# Patient Record
Sex: Male | Born: 1980 | Race: Black or African American | Hispanic: No | Marital: Single | Smoking: Never smoker
Health system: Southern US, Community
[De-identification: ages and names within clinical notes are randomized; demographics above are authoritative.]

## PROBLEM LIST (undated history)

## (undated) DIAGNOSIS — Q909 Down syndrome, unspecified: Secondary | ICD-10-CM

---
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
## 2002-05-24 ENCOUNTER — Emergency Department (HOSPITAL_COMMUNITY): Admission: EM | Admit: 2002-05-24 | Discharge: 2002-05-25 | Payer: Self-pay | Admitting: Emergency Medicine

## 2004-07-03 ENCOUNTER — Emergency Department (HOSPITAL_COMMUNITY): Admission: EM | Admit: 2004-07-03 | Discharge: 2004-07-03 | Payer: Self-pay | Admitting: Emergency Medicine

## 2004-09-07 ENCOUNTER — Emergency Department (HOSPITAL_COMMUNITY): Admission: EM | Admit: 2004-09-07 | Discharge: 2004-09-07 | Payer: Self-pay | Admitting: Emergency Medicine

## 2017-03-02 ENCOUNTER — Emergency Department (HOSPITAL_COMMUNITY): Payer: Medicaid Other

## 2017-03-02 ENCOUNTER — Encounter (HOSPITAL_COMMUNITY): Payer: Self-pay | Admitting: Emergency Medicine

## 2017-03-02 ENCOUNTER — Emergency Department (HOSPITAL_COMMUNITY)
Admission: EM | Admit: 2017-03-02 | Discharge: 2017-03-02 | Disposition: A | Discharge status: Home / Self Care | Payer: Medicaid Other | Attending: Emergency Medicine | Admitting: Emergency Medicine

## 2017-03-02 DIAGNOSIS — R109 Unspecified abdominal pain: Secondary | ICD-10-CM | POA: Insufficient documentation

## 2017-03-02 DIAGNOSIS — Z79899 Other long term (current) drug therapy: Secondary | ICD-10-CM | POA: Insufficient documentation

## 2017-03-02 HISTORY — DX: Down syndrome, unspecified: Q90.9

## 2017-03-02 LAB — URINALYSIS, ROUTINE W REFLEX MICROSCOPIC
Bilirubin Urine: NEGATIVE
Glucose, UA: NEGATIVE mg/dL
Hgb urine dipstick: NEGATIVE
Ketones, ur: NEGATIVE mg/dL
Leukocytes, UA: NEGATIVE
Nitrite: NEGATIVE
Protein, ur: NEGATIVE mg/dL
Specific Gravity, Urine: 1.001 — ABNORMAL LOW (ref 1.005–1.030)
pH: 7 (ref 5.0–8.0)

## 2017-03-02 MED ORDER — POLYETHYLENE GLYCOL 3350 17 GM/SCOOP PO POWD: 17.0000 g | Freq: Two times a day (BID) | ORAL | 0 refills | Status: AC

## 2017-03-02 MED ORDER — IBUPROFEN 100 MG/5ML PO SUSP
400.0000 mg | Freq: Once | ORAL | Status: AC
  Administered 2017-03-02: 400 mg via ORAL
  Filled 2017-03-02: qty 20

## 2017-03-02 NOTE — ED Notes (Signed)
Transported to radiology 

## 2017-03-02 NOTE — ED Provider Notes (Signed)
[facility]-EMERGENCY DEPT Provider Note   CSN: [id number] Arrival date & time: 03/02/17  0030   By signing my name below, I, Guy Mcfarland, attest that this documentation has been prepared under the direction and in the presence of [facility] Shaneta Cervenka, [name]. Electronically Signed: Teofilo PodMatthew P. Mcfarland, ED Scribe. 03/02/2017. 1:42 AM.   History   Chief Complaint Chief Complaint  Patient presents with  . Flank Pain    The history is provided by medical records and a relative. The history is limited by a developmental delay. No language interpreter was used.   HPI Comments:  Guy Mcfarland is a 36 y.o. male with PMHx of Down syndrome who presents to the Emergency Department with mom who reports possible flank pain x 3 hours. Mom reports that when pt laid down at home after he went to his program he was expressing pain by grimacing and she reports he cried when she palpated the right flank area. She reports that pt has been walking slower than usual today.  She reports he often has a strong odor about his urine and it was dark today. Mom reports that pt has an inguinal hernia without planned repair. Mom also notes that pt has had a mild cough. She states that he is nonverbal. Mom gave him tylenol 1 hour ago with no relief.   LEVEL 5 CAVEAT for nonverbal.    Past Medical History:  Diagnosis Date  . Down syndrome     There are no active problems to display for this patient.   History reviewed. No pertinent surgical history.     Home Medications    Prior to Admission medications   Medication Sig Start Date End Date Taking? Authorizing Provider  polyethylene glycol powder (GLYCOLAX/MIRALAX) powder Take 17 g by mouth 2 (two) times daily. 03/02/17   [name], [name]    Family History No family history on file.  Social History Social History  Substance Use Topics  . Smoking status: Never Smoker  . Smokeless tobacco: Never Used  . Alcohol use No     Allergies     Penicillins   Review of Systems Review of Systems  Unable to perform ROS: Patient nonverbal     Physical Exam Updated Vital Signs BP (!) 133/96 (BP Location: Left Arm)   Pulse 70   Resp 18   SpO2 100%   Physical Exam  Constitutional: He appears well-developed and well-nourished. No distress.  Awake, alert, nontoxic appearance Intermittently grimacing  HENT:  Head: Normocephalic and atraumatic.  Mouth/Throat: Mucous membranes are normal.  Eyes: Conjunctivae are normal. No scleral icterus.  Neck: Normal range of motion. Neck supple.  Cardiovascular: Normal rate, regular rhythm and intact distal pulses.   Pulmonary/Chest: Effort normal and breath sounds normal. No respiratory distress. He has no wheezes.  Equal chest expansion  Abdominal: Soft. Bowel sounds are normal. He exhibits no distension and no mass. There is no splenomegaly or hepatomegaly. There is no tenderness. There is no rigidity, no rebound, no guarding and no CVA tenderness. A hernia is present. Hernia confirmed positive in the right inguinal area ( easily reducible ). Hernia confirmed negative in the ventral area and confirmed negative in the left inguinal area.  Genitourinary: Testes normal. Cremasteric reflex is present. Right testis shows no mass, no swelling and no tenderness. Right testis is descended. Cremasteric reflex is not absent on the right side. Left testis shows no mass, no swelling and no tenderness. Left testis is descended. Cremasteric reflex is not  absent on the left side. Uncircumcised.  Musculoskeletal: Normal range of motion.  Lymphadenopathy: No inguinal adenopathy noted on the right or left side.  Neurological: He is alert.  Skin: Skin is warm and dry. He is not diaphoretic.  Psychiatric: He has a normal mood and affect.  Nursing note and vitals reviewed.    ED Treatments / Results  DIAGNOSTIC STUDIES:  Oxygen Saturation is 100% on RA, normal by my interpretation.    COORDINATION OF  CARE:  1:36 AM Discussed treatment plan with pt's mom at bedside and she agreed to plan.   Labs (all labs ordered are listed, but only abnormal results are displayed) Labs Reviewed  URINALYSIS, ROUTINE W REFLEX MICROSCOPIC - Abnormal; Notable for the following:       Result Value   Color, Urine COLORLESS (*)    Specific Gravity, Urine 1.001 (*)    All other components within normal limits    Radiology Dg Chest 2 View  Result Date: 03/02/2017 CLINICAL DATA:  Cough for 24 hours. EXAM: CHEST  2 VIEW COMPARISON:  None. FINDINGS: The lungs are clear. The pulmonary vasculature is normal. Heart size is normal. Hilar and mediastinal contours are unremarkable. There is no pleural effusion. IMPRESSION: No active cardiopulmonary disease. Electronically Signed   By: Ellery Plunk M.D.   On: 03/02/2017 02:43    Procedures Procedures (including critical care time)  Medications Ordered in ED Medications  ibuprofen (ADVIL,MOTRIN) 100 MG/5ML suspension 400 mg (400 mg Oral Given 03/02/17 0155)     Initial Impression / Assessment and Plan / ED Course  I have reviewed the triage vital signs and the nursing notes.  Pertinent labs & imaging results that were available during my care of the patient were reviewed by me and considered in my medical decision making (see chart for details).  Clinical Course as of Mar 02 406  Thu Mar 02, 2017  0404 Repeat abd exam remains soft and nontender  [HM]    Clinical Course User Index [HM] Dahlia Client Guy Valtierra, [name]    Pt presents with mother who states concern about possible pain.  Pt grimacing on my exam, but pain does not appear reproducible.  Pt abd is soft and nontender.  No testicular pain and hernia is easily reducible.    UA is colorless and with decreased specific gravity.  Mother reports pt urinated in the hat and this urine was placed in the sample cup.  Mother denies contamination with water.  She reports pt had a very small BM tonight with 2  small pellets.  She reports she believes he is constipated.  Pt's pain has resolved and he is without grimace.  Repeat abd exam remains benign.  CXR without pneumonia.  Unable to obtain temperature in ED; mother reports normal temperature with tympanic thermometer at home.  Pt skin is warm and dry but not hot to touch or diaphoretic.    Long discussion with mother about urine and further evaluation.  I recommended sedation for bloodwork and CT scan to evaluate for possible nephrolithiasis, appendicitis vs other abd pathology.  Mother declines further work-up in the ED.  She specifically refuses bloodwork and CT scan.  Pt is well appearing and mother reports she will make an appointment with PCP for today.  Discussed reasons for immediate return to the ED including increased pain, fever, vomiting or any other symptoms.    BP 128/88   Pulse 72   Resp 18   SpO2 100%     Final  Clinical Impressions(s) / ED Diagnoses   Final diagnoses:  Right flank pain    New Prescriptions New Prescriptions   POLYETHYLENE GLYCOL POWDER (GLYCOLAX/MIRALAX) POWDER    Take 17 g by mouth 2 (two) times daily.    I personally performed the services described in this documentation, which was scribed in my presence. The recorded information has been reviewed and is accurate.     Dierdre Forth, [name] 03/02/17 0545    April Palumbo, MD 03/02/17 [phone]

## 2017-03-02 NOTE — ED Notes (Signed)
Provided ice water for Fluid PO challenge.

## 2017-03-02 NOTE — ED Triage Notes (Signed)
Pt brought in for c/o flank pain   Family states he went to his program today and when he got home tonight he was walking slower than normal and went and sat on his bed  States when she went to assist him to lay down he acted like it hurt  States he would moan and cry out when she would rub his right flank area  She gave Tylenol 500mg  prior to coming here

## 2017-03-02 NOTE — Discharge Instructions (Signed)
1. Medications: Miralax for constipation; usual home medications 2. Treatment: rest, drink plenty of fluids,  3. Follow Up: Please followup with your primary doctor in 1-2 days for discussion of your diagnoses and further evaluation after today's visit; if you do not have a primary care doctor use the resource guide provided to find one; Please return to the ER for fevers, vomiting, worsening pain, or other concerning symptoms

## 2018-08-20 IMAGING — CR DG CHEST 2V
2 series · 2 of 2 positions shown · non-contrast
Comparison: None.

CLINICAL DATA: Cough for 24 hours.

EXAM:
CHEST  2 VIEW

[x chest ap]
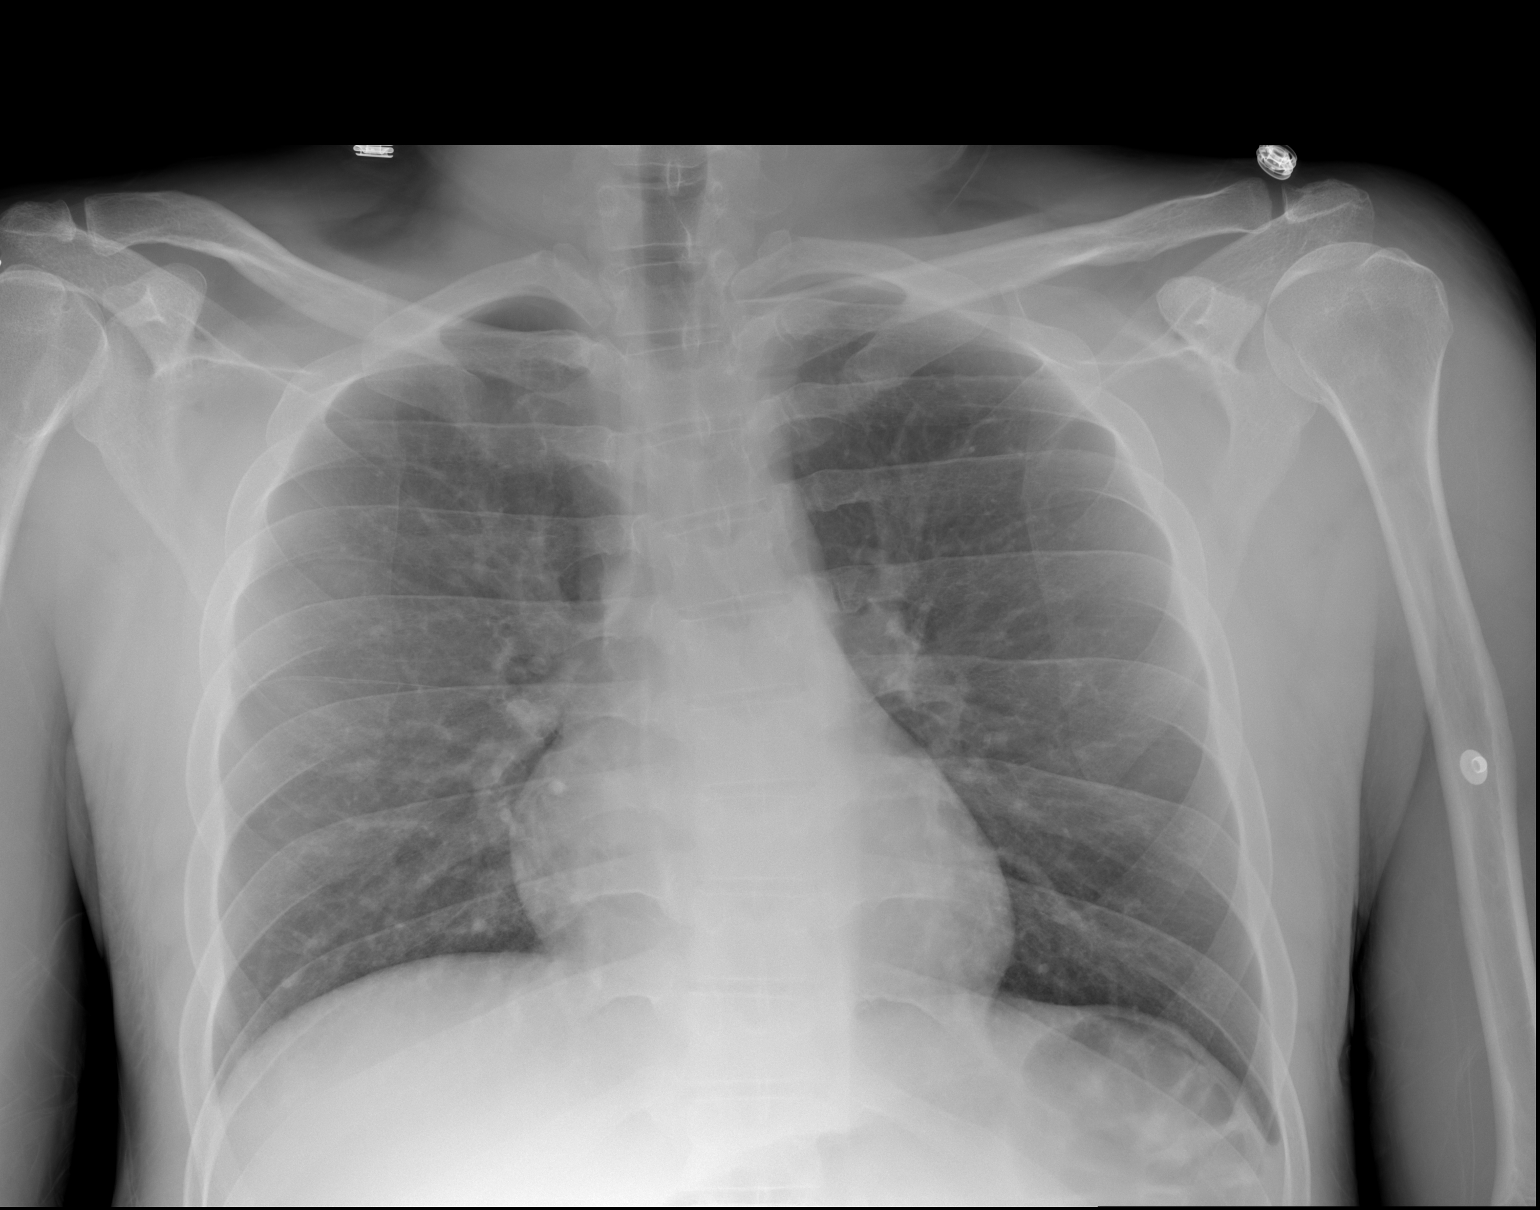

[w chest lat]
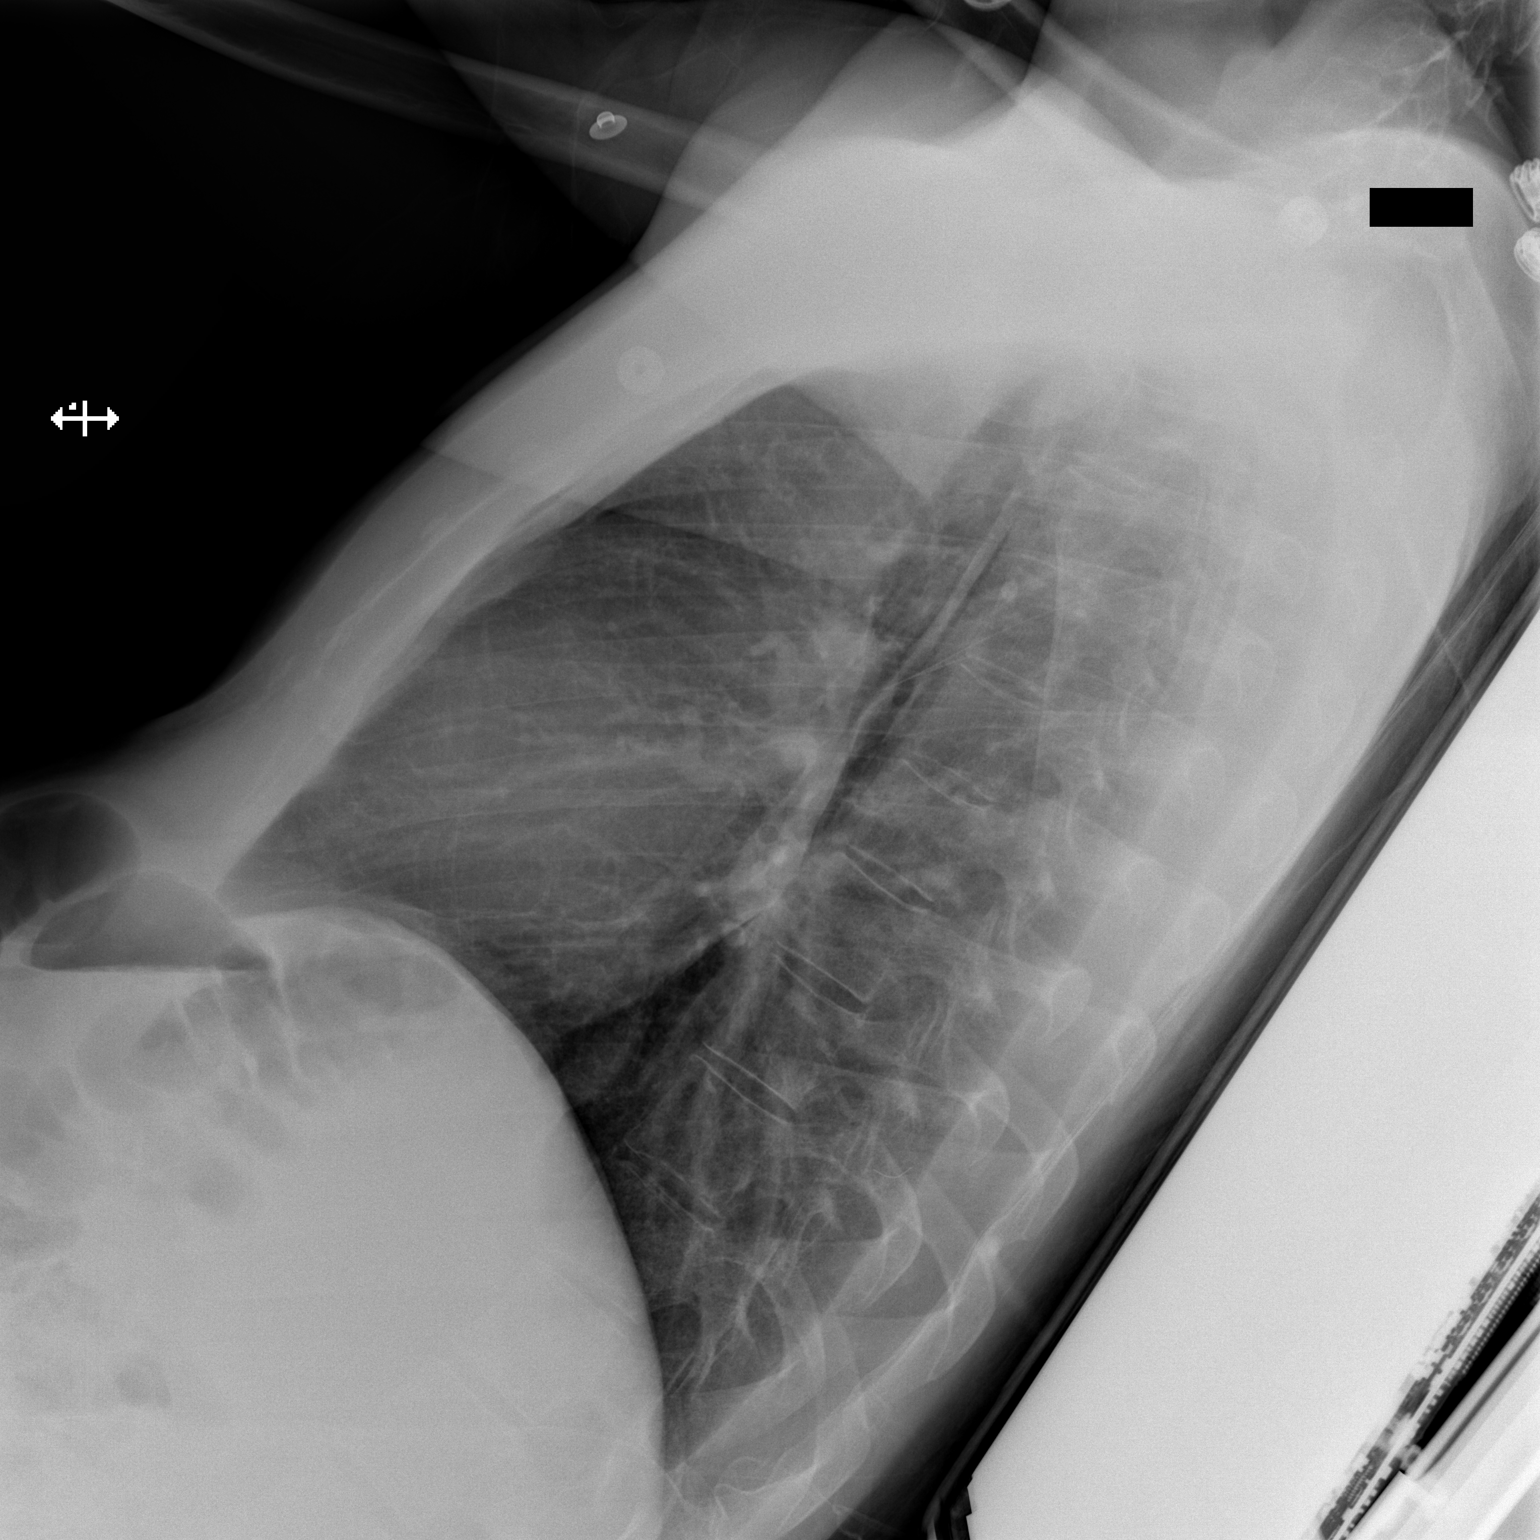

[2 of 2 positions shown; findings below may reference images not displayed]

FINDINGS: The lungs are clear. The pulmonary vasculature is normal. Heart size
is normal. Hilar and mediastinal contours are unremarkable. There is
no pleural effusion.
IMPRESSION: No active cardiopulmonary disease.

## 2019-09-18 ENCOUNTER — Other Ambulatory Visit: Payer: Self-pay

## 2019-09-18 DIAGNOSIS — Z20822 Contact with and (suspected) exposure to covid-19: Secondary | ICD-10-CM

## 2019-09-21 LAB — NOVEL CORONAVIRUS, NAA: SARS-CoV-2, NAA: DETECTED — AB

## 2020-02-10 ENCOUNTER — Ambulatory Visit: Payer: Medicaid Other | Attending: Internal Medicine

## 2023-04-29 ENCOUNTER — Emergency Department (HOSPITAL_COMMUNITY)
Admission: EM | Admit: 2023-04-29 | Discharge: 2023-04-30 | Disposition: A | Discharge status: Home / Self Care | Payer: Medicare Other | Attending: Emergency Medicine | Admitting: Emergency Medicine

## 2023-04-29 ENCOUNTER — Emergency Department (HOSPITAL_COMMUNITY): Payer: Medicare Other

## 2023-04-29 ENCOUNTER — Other Ambulatory Visit: Payer: Self-pay

## 2023-04-29 DIAGNOSIS — X58XXXA Exposure to other specified factors, initial encounter: Secondary | ICD-10-CM | POA: Diagnosis not present

## 2023-04-29 DIAGNOSIS — S60512A Abrasion of left hand, initial encounter: Secondary | ICD-10-CM | POA: Diagnosis not present

## 2023-04-29 DIAGNOSIS — S6992XA Unspecified injury of left wrist, hand and finger(s), initial encounter: Secondary | ICD-10-CM | POA: Diagnosis present

## 2023-04-29 DIAGNOSIS — M79642 Pain in left hand: Secondary | ICD-10-CM

## 2023-04-29 NOTE — ED Triage Notes (Signed)
Patient coming to ED for evaluation of left hand pain.  Family reports that "they brought him home from his program tonight and said they he was complaining of his hand hurting."  Unknown injury.  Family reports "they said it just started to look like that."  Having swelling and redness to L hand.

## 2023-04-30 ENCOUNTER — Emergency Department (HOSPITAL_COMMUNITY): Payer: Medicare Other

## 2023-04-30 DIAGNOSIS — S60512A Abrasion of left hand, initial encounter: Secondary | ICD-10-CM | POA: Diagnosis not present

## 2023-04-30 NOTE — ED Provider Notes (Signed)
[place] EMERGENCY DEPARTMENT AT [facility] Provider Note   CSN: [id number] Arrival date & time: 04/29/23  2255     History  Chief Complaint  Patient presents with   Hand Pain    Guy Mcfarland is a 42 y.o. male who is nonverbal with history of Down syndrome who presents with his mother and brother at the bedside with concern for left hand redness and swelling as well as itching.  He states that they picked him up from his program this evening and it was reported to them that his left hand was red and swollen and he appears to be itching it.  No witnessed injury.  Level 5 caveat due to nonverbal status of the patient.  HPI     Home Medications Prior to Admission medications   Medication Sig Start Date End Date Taking? Authorizing Provider  cetirizine (ZYRTEC) 10 MG tablet Take 10 mg by mouth daily as needed for allergies. 03/07/23  Yes [provider]  polyethylene glycol powder (GLYCOLAX/MIRALAX) powder Take 17 g by mouth 2 (two) times daily. 03/02/17   Muthersbaugh, Dahlia Client, [name]      Allergies    Penicillins    Review of Systems   Review of Systems  Unable to perform ROS: Patient nonverbal    Physical Exam Updated Vital Signs BP (!) 144/97 (BP Location: Right Arm)   Pulse 62   Temp 98.5 F (36.9 C) (Oral)   Resp 18   SpO2 98%  Physical Exam Vitals and nursing note reviewed.  Constitutional:      Appearance: He is not ill-appearing or toxic-appearing.  HENT:     Head: Normocephalic and atraumatic.  Eyes:     General: No scleral icterus.       Right eye: No discharge.        Left eye: No discharge.     Conjunctiva/sclera: Conjunctivae normal.  Pulmonary:     Effort: Pulmonary effort is normal.  Musculoskeletal:       Hands:  Skin:    General: Skin is warm and dry.     Findings: Abrasion present.     Comments: No visible bite marks on the back of the left hand  Neurological:     General: No focal deficit present.     Mental  Status: He is alert.  Psychiatric:        Mood and Affect: Mood normal.     ED Results / Procedures / Treatments   Labs (all labs ordered are listed, but only abnormal results are displayed) Labs Reviewed - No data to display  EKG None  Radiology DG Hand Complete Left  Result Date: 04/30/2023 CLINICAL DATA:  Hand pain and swelling, initial encounter EXAM: LEFT HAND - COMPLETE 3+ VIEW COMPARISON:  None Available. FINDINGS: There is no evidence of fracture or dislocation. There is no evidence of arthropathy or other focal bone abnormality. Soft tissue swelling is noted. No foreign body is seen. IMPRESSION: Soft tissue swelling without acute bony abnormality. Electronically Signed   By: Alcide Clever M.D.   On: 04/30/2023 00:45    Procedures Procedures    Medications Ordered in ED Medications - No data to display  ED Course/ Medical Decision Making/ A&P                             Medical Decision Making Amount and/or Complexity of Data Reviewed Radiology: ordered.   42 year old male with  left hand erythema and soft tissue swelling.  Per family and intake x-ray, patient had notable swelling of the dorsum of the left hand now significantly improved and nearly returned to normal.  Suspect contact dermatitis versus localized reaction to insect bite.  Patient with history of paradoxical reaction to Benadryl, oral antihistamine with hydroxyzine offered in the ED but family preference to apply topical hydrocortisone at home.  Feel this is reasonable.  No further workup warranted near this time as clinical concern for emergent underlying etiology for this patient symptoms that would warrant further ED workup or inpatient management is exceedingly low.  Guy Mcfarland's family  voiced understanding of his medical evaluation and treatment plan. Each of their questions answered to their expressed satisfaction.  Return precautions were given.  Patient is well-appearing, stable, and was discharged in  good condition.  This chart was dictated using voice recognition software, Dragon. Despite the best efforts of this provider to proofread and correct errors, errors may still occur which can change documentation meaning.    Final Clinical Impression(s) / ED Diagnoses Final diagnoses:  None    Rx / DC Orders ED Discharge Orders     None         Paris Lore, [name] 04/30/23 0132    Gilda Crease, MD 05/01/23 [phone]

## 2023-04-30 NOTE — Discharge Instructions (Signed)
Guy Mcfarland was seen in the ER today for his left hand pain.  His x-ray was reassuring with no broken bones.  Suspect he had an insect bite.  You may apply topical hydrocortisone at home, ice and antihistamine such as cetirizine for any itching.  Follow-up with primary care doctor and return to the ER with any new severe symptoms.
